# Patient Record
Sex: Female | Born: 1985 | Race: White | Hispanic: No | Marital: Married | Smoking: Former smoker
Health system: Southern US, Community
[De-identification: ages and names within clinical notes are randomized; demographics above are authoritative.]

## PROBLEM LIST (undated history)

## (undated) DIAGNOSIS — N83209 Unspecified ovarian cyst, unspecified side: Secondary | ICD-10-CM

## (undated) DIAGNOSIS — K589 Irritable bowel syndrome without diarrhea: Secondary | ICD-10-CM

## (undated) HISTORY — PX: WISDOM TOOTH EXTRACTION: SHX21

---

## 2013-02-20 LAB — OB RESULTS CONSOLE ABO/RH: RH Type: POSITIVE

## 2013-02-20 LAB — OB RESULTS CONSOLE GC/CHLAMYDIA
Chlamydia: NEGATIVE
Gonorrhea: NEGATIVE

## 2013-02-20 LAB — OB RESULTS CONSOLE HEPATITIS B SURFACE ANTIGEN: Hepatitis B Surface Ag: NEGATIVE

## 2013-02-20 LAB — OB RESULTS CONSOLE RPR: RPR: NONREACTIVE

## 2013-02-20 LAB — OB RESULTS CONSOLE RUBELLA ANTIBODY, IGM: Rubella: IMMUNE

## 2013-02-20 LAB — OB RESULTS CONSOLE HIV ANTIBODY (ROUTINE TESTING): HIV: NONREACTIVE

## 2013-03-13 ENCOUNTER — Inpatient Hospital Stay (HOSPITAL_COMMUNITY): Admission: AD | Admit: 2013-03-13 | Payer: Self-pay | Source: Ambulatory Visit | Admitting: Obstetrics and Gynecology

## 2013-07-03 ENCOUNTER — Other Ambulatory Visit: Payer: Self-pay | Admitting: Obstetrics and Gynecology

## 2013-07-03 ENCOUNTER — Other Ambulatory Visit (HOSPITAL_COMMUNITY): Payer: Self-pay | Admitting: Obstetrics and Gynecology

## 2013-07-03 DIAGNOSIS — R1011 Right upper quadrant pain: Secondary | ICD-10-CM

## 2013-07-05 ENCOUNTER — Ambulatory Visit (HOSPITAL_COMMUNITY)
Admission: RE | Admit: 2013-07-05 | Discharge: 2013-07-05 | Disposition: A | Discharge status: Home / Self Care | Payer: BC Managed Care – PPO | Source: Ambulatory Visit | Attending: Obstetrics and Gynecology | Admitting: Obstetrics and Gynecology

## 2013-07-05 ENCOUNTER — Ambulatory Visit (HOSPITAL_COMMUNITY): Payer: Self-pay

## 2013-07-05 DIAGNOSIS — R1011 Right upper quadrant pain: Secondary | ICD-10-CM | POA: Insufficient documentation

## 2013-07-05 DIAGNOSIS — K824 Cholesterolosis of gallbladder: Secondary | ICD-10-CM | POA: Insufficient documentation

## 2013-07-05 DIAGNOSIS — R110 Nausea: Secondary | ICD-10-CM | POA: Insufficient documentation

## 2013-09-24 ENCOUNTER — Encounter (HOSPITAL_COMMUNITY): Payer: BC Managed Care – PPO | Admitting: Anesthesiology

## 2013-09-24 ENCOUNTER — Inpatient Hospital Stay (HOSPITAL_COMMUNITY): Payer: BC Managed Care – PPO | Admitting: Anesthesiology

## 2013-09-24 ENCOUNTER — Encounter (HOSPITAL_COMMUNITY): Payer: Self-pay | Admitting: *Deleted

## 2013-09-24 ENCOUNTER — Inpatient Hospital Stay (HOSPITAL_COMMUNITY)
Admission: AD | Admit: 2013-09-24 | Discharge: 2013-09-27 | DRG: 775 | Disposition: A | Discharge status: Home / Self Care | Payer: BC Managed Care – PPO | Source: Ambulatory Visit | Attending: Obstetrics and Gynecology | Admitting: Obstetrics and Gynecology

## 2013-09-24 DIAGNOSIS — Z3490 Encounter for supervision of normal pregnancy, unspecified, unspecified trimester: Secondary | ICD-10-CM

## 2013-09-24 DIAGNOSIS — Z87891 Personal history of nicotine dependence: Secondary | ICD-10-CM

## 2013-09-24 HISTORY — DX: Unspecified ovarian cyst, unspecified side: N83.209

## 2013-09-24 HISTORY — DX: Irritable bowel syndrome without diarrhea: K58.9

## 2013-09-24 LAB — ABO/RH: ABO/RH(D): O POS

## 2013-09-24 LAB — CBC
HCT: 36.6 % (ref 36.0–46.0)
Hemoglobin: 12.5 g/dL (ref 12.0–15.0)
MCHC: 34.2 g/dL (ref 30.0–36.0)
Platelets: 238 10*3/uL (ref 150–400)
RDW: 13.6 % (ref 11.5–15.5)
WBC: 13.3 10*3/uL — ABNORMAL HIGH (ref 4.0–10.5)

## 2013-09-24 LAB — RPR: RPR Ser Ql: NONREACTIVE

## 2013-09-24 LAB — TYPE AND SCREEN: ABO/RH(D): O POS

## 2013-09-24 LAB — POCT FERN TEST

## 2013-09-24 MED ORDER — LIDOCAINE HCL (PF) 1 % IJ SOLN
INTRAMUSCULAR | Status: DC | PRN
  Administered 2013-09-24 (×4): 4 mL

## 2013-09-24 MED ORDER — LACTATED RINGERS IV SOLN: 500.0000 mL | Freq: Once | INTRAVENOUS | Status: DC

## 2013-09-24 MED ORDER — LACTATED RINGERS IV SOLN
INTRAVENOUS | Status: DC
  Administered 2013-09-24 (×2): via INTRAVENOUS

## 2013-09-24 MED ORDER — PHENYLEPHRINE 40 MCG/ML (10ML) SYRINGE FOR IV PUSH (FOR BLOOD PRESSURE SUPPORT)
80.0000 ug | PREFILLED_SYRINGE | INTRAVENOUS | Status: DC | PRN
  Filled 2013-09-24: qty 10

## 2013-09-24 MED ORDER — EPHEDRINE 5 MG/ML INJ
10.0000 mg | INTRAVENOUS | Status: DC | PRN
  Filled 2013-09-24: qty 4

## 2013-09-24 MED ORDER — OXYTOCIN BOLUS FROM INFUSION
500.0000 mL | INTRAVENOUS | Status: DC
  Administered 2013-09-25: 500 mL via INTRAVENOUS

## 2013-09-24 MED ORDER — ONDANSETRON HCL 4 MG/2ML IJ SOLN: 4.0000 mg | Freq: Four times a day (QID) | INTRAMUSCULAR | Status: DC | PRN

## 2013-09-24 MED ORDER — IBUPROFEN 600 MG PO TABS
600.0000 mg | ORAL_TABLET | Freq: Four times a day (QID) | ORAL | Status: DC | PRN
  Administered 2013-09-25: 600 mg via ORAL
  Filled 2013-09-24: qty 1

## 2013-09-24 MED ORDER — EPHEDRINE 5 MG/ML INJ: 10.0000 mg | INTRAVENOUS | Status: DC | PRN

## 2013-09-24 MED ORDER — OXYTOCIN 40 UNITS IN LACTATED RINGERS INFUSION - SIMPLE MED: 62.5000 mL/h | INTRAVENOUS | Status: DC

## 2013-09-24 MED ORDER — ZOLPIDEM TARTRATE 5 MG PO TABS
5.0000 mg | ORAL_TABLET | Freq: Every evening | ORAL | Status: DC | PRN
  Administered 2013-09-25: 5 mg via ORAL
  Filled 2013-09-24: qty 1

## 2013-09-24 MED ORDER — LIDOCAINE HCL (PF) 1 % IJ SOLN
30.0000 mL | INTRAMUSCULAR | Status: DC | PRN
  Administered 2013-09-25: 30 mL via SUBCUTANEOUS
  Filled 2013-09-24: qty 30

## 2013-09-24 MED ORDER — DIPHENHYDRAMINE HCL 50 MG/ML IJ SOLN: 12.5000 mg | INTRAMUSCULAR | Status: DC | PRN

## 2013-09-24 MED ORDER — LACTATED RINGERS IV SOLN: 500.0000 mL | INTRAVENOUS | Status: DC | PRN

## 2013-09-24 MED ORDER — PHENYLEPHRINE 40 MCG/ML (10ML) SYRINGE FOR IV PUSH (FOR BLOOD PRESSURE SUPPORT): 80.0000 ug | PREFILLED_SYRINGE | INTRAVENOUS | Status: DC | PRN

## 2013-09-24 MED ORDER — FENTANYL 2.5 MCG/ML BUPIVACAINE 1/10 % EPIDURAL INFUSION (WH - ANES)
14.0000 mL/h | INTRAMUSCULAR | Status: DC | PRN
  Administered 2013-09-24 – 2013-09-25 (×2): 14 mL/h via EPIDURAL
  Filled 2013-09-24 (×2): qty 125

## 2013-09-24 MED ORDER — ACETAMINOPHEN 325 MG PO TABS: 650.0000 mg | ORAL_TABLET | ORAL | Status: DC | PRN

## 2013-09-24 MED ORDER — CITRIC ACID-SODIUM CITRATE 334-500 MG/5ML PO SOLN: 30.0000 mL | ORAL | Status: DC | PRN

## 2013-09-24 NOTE — Progress Notes (Signed)
Notified Dr.Adkins of FHR, pt SVE with slow change since rupture, and pt denies discomfort at this time. No new orders at this time.

## 2013-09-24 NOTE — Progress Notes (Signed)
Dr Renaldo Fiddler notified of patient, her c/o srom. Fern positive, tracing, ctx pattern, cervical exam and aware that patient wants epidural (history of severe needle phobia). Dr Renaldo Fiddler states that she will enter orders to admit to L/D unit.

## 2013-09-24 NOTE — Anesthesia Procedure Notes (Signed)
Epidural Patient location during procedure: OB Start time: 09/24/2013 4:44 PM  Staffing Performed by: anesthesiologist   Preanesthetic Checklist Completed: patient identified, site marked, surgical consent, pre-op evaluation, timeout performed, IV checked, risks and benefits discussed and monitors and equipment checked  Epidural Patient position: sitting Prep: site prepped and draped and DuraPrep Patient monitoring: continuous pulse ox and blood pressure Approach: midline Injection technique: LOR air  Needle:  Needle type: Tuohy  Needle gauge: 17 G Needle length: 9 cm and 9 Needle insertion depth: 6 cm Catheter type: closed end flexible Catheter size: 19 Gauge Catheter at skin depth: 11 cm Test dose: negative  Assessment Events: blood not aspirated, injection not painful, no injection resistance, negative IV test and no paresthesia  Additional Notes Discussed risk of headache, infection, bleeding, nerve injury and failed or incomplete block.  Patient voices understanding and wishes to proceed.  Epidural placed easily on first attempt.  No paresthesia.  Patient tolerated procedure well with no apparent complications.  Jasmine December, MDReason for block:procedure for pain

## 2013-09-24 NOTE — MAU Note (Signed)
Started leaking clear fluid around 0800, still coming, slightly pink tint now. No active bleeding.  Kind of cramy. Last week was fingertip.

## 2013-09-24 NOTE — Anesthesia Preprocedure Evaluation (Signed)
Anesthesia Evaluation  Patient identified by MRN, date of birth, ID band Patient awake    Reviewed: Allergy & Precautions, H&P , NPO status , Patient's Chart, lab work & pertinent test results, reviewed documented beta blocker date and time   History of Anesthesia Complications Negative for: history of anesthetic complications  Airway Mallampati: I TM Distance: >3 FB Neck ROM: full    Dental  (+) Teeth Intact   Pulmonary former smoker,  breath sounds clear to auscultation        Cardiovascular negative cardio ROS  Rhythm:regular Rate:Normal     Neuro/Psych negative neurological ROS  negative psych ROS   GI/Hepatic Neg liver ROS, IBS   Endo/Other  BMI 37  Renal/GU negative Renal ROS  negative genitourinary   Musculoskeletal   Abdominal   Peds  Hematology negative hematology ROS (+)   Anesthesia Other Findings   Reproductive/Obstetrics (+) Pregnancy                           Anesthesia Physical Anesthesia Plan  ASA: II  Anesthesia Plan: Epidural   Post-op Pain Management:    Induction:   Airway Management Planned:   Additional Equipment:   Intra-op Plan:   Post-operative Plan:   Informed Consent: I have reviewed the patients History and Physical, chart, labs and discussed the procedure including the risks, benefits and alternatives for the proposed anesthesia with the patient or authorized representative who has indicated his/her understanding and acceptance.     Plan Discussed with:   Anesthesia Plan Comments:         Anesthesia Quick Evaluation

## 2013-09-24 NOTE — H&P (Signed)
Tara Barnett is a 27 y.o. female presenting for SROM & ctx.  Pt reports clear fluid gush followed by increase contractions.  History OB History   Grav Para Term Preterm Abortions TAB SAB Ect Mult Living   1              Past Medical History  Diagnosis Date  . IBS (irritable bowel syndrome)   . Ovarian cyst    Past Surgical History  Procedure Laterality Date  . Wisdom tooth extraction     Family History: family history includes Diabetes in her maternal grandfather; Stroke in her maternal grandfather. There is no history of Hearing loss. Social History:  reports that she has quit smoking. She has never used smokeless tobacco. She reports that she does not drink alcohol or use illicit drugs.   Prenatal Transfer Tool  Maternal Diabetes: No Genetic Screening: Declined Maternal Ultrasounds/Referrals: Normal Fetal Ultrasounds or other Referrals:  None Maternal Substance Abuse:  No Significant Maternal Medications:  None Significant Maternal Lab Results:  None Other Comments:  None  ROS  Dilation: 1 Effacement (%): 60 Station: -3 Exam by:: Peace [place], rn Blood pressure 139/80, pulse 88, temperature 98.4 F (36.9 C), temperature source Oral, resp. rate 20, height 5\' 8"  (1.727 m), weight 109.77 kg (242 lb). Exam Physical Exam  Prenatal labs: ABO, Rh: O/Positive/-- (05/27 0000) Antibody: Negative (05/27 0000) Rubella: Immune (05/27 0000) RPR: Nonreactive (05/27 0000)  HBsAg: Negative (05/27 0000)  HIV: Non-reactive (05/27 0000)  GBS: Negative (12/18 0000)   Assessment/Plan: SROM/LABOR Admit Exp mngt Epidural prn   Tara Barnett 09/24/2013, 4:34 PM

## 2013-09-25 ENCOUNTER — Encounter (HOSPITAL_COMMUNITY): Payer: Self-pay

## 2013-09-25 MED ORDER — WITCH HAZEL-GLYCERIN EX PADS: 1.0000 "application " | MEDICATED_PAD | CUTANEOUS | Status: DC | PRN

## 2013-09-25 MED ORDER — DIBUCAINE 1 % RE OINT
1.0000 "application " | TOPICAL_OINTMENT | RECTAL | Status: DC | PRN
  Filled 2013-09-25: qty 28

## 2013-09-25 MED ORDER — PRENATAL MULTIVITAMIN CH
1.0000 | ORAL_TABLET | Freq: Every day | ORAL | Status: DC
  Administered 2013-09-25 – 2013-09-27 (×3): 1 via ORAL
  Filled 2013-09-25 (×3): qty 1

## 2013-09-25 MED ORDER — BENZOCAINE-MENTHOL 20-0.5 % EX AERO
1.0000 "application " | INHALATION_SPRAY | CUTANEOUS | Status: DC | PRN
  Administered 2013-09-25 – 2013-09-27 (×2): 1 via TOPICAL
  Filled 2013-09-25 (×3): qty 56

## 2013-09-25 MED ORDER — DIPHENHYDRAMINE HCL 25 MG PO CAPS: 25.0000 mg | ORAL_CAPSULE | Freq: Four times a day (QID) | ORAL | Status: DC | PRN

## 2013-09-25 MED ORDER — SIMETHICONE 80 MG PO CHEW: 80.0000 mg | CHEWABLE_TABLET | ORAL | Status: DC | PRN

## 2013-09-25 MED ORDER — IBUPROFEN 600 MG PO TABS
600.0000 mg | ORAL_TABLET | Freq: Four times a day (QID) | ORAL | Status: DC
  Administered 2013-09-25 – 2013-09-27 (×9): 600 mg via ORAL
  Filled 2013-09-25 (×9): qty 1

## 2013-09-25 MED ORDER — TETANUS-DIPHTH-ACELL PERTUSSIS 5-2.5-18.5 LF-MCG/0.5 IM SUSP: 0.5000 mL | Freq: Once | INTRAMUSCULAR | Status: DC

## 2013-09-25 MED ORDER — ONDANSETRON HCL 4 MG/2ML IJ SOLN: 4.0000 mg | INTRAMUSCULAR | Status: DC | PRN

## 2013-09-25 MED ORDER — TERBUTALINE SULFATE 1 MG/ML IJ SOLN: 0.2500 mg | Freq: Once | INTRAMUSCULAR | Status: DC | PRN

## 2013-09-25 MED ORDER — MEDROXYPROGESTERONE ACETATE 150 MG/ML IM SUSP: 150.0000 mg | INTRAMUSCULAR | Status: DC | PRN

## 2013-09-25 MED ORDER — ONDANSETRON HCL 4 MG PO TABS: 4.0000 mg | ORAL_TABLET | ORAL | Status: DC | PRN

## 2013-09-25 MED ORDER — MEASLES, MUMPS & RUBELLA VAC ~~LOC~~ INJ
0.5000 mL | INJECTION | Freq: Once | SUBCUTANEOUS | Status: DC
  Filled 2013-09-25: qty 0.5

## 2013-09-25 MED ORDER — OXYTOCIN 40 UNITS IN LACTATED RINGERS INFUSION - SIMPLE MED
1.0000 m[IU]/min | INTRAVENOUS | Status: DC
  Administered 2013-09-25: 2 m[IU]/min via INTRAVENOUS
  Filled 2013-09-25: qty 1000

## 2013-09-25 MED ORDER — SENNOSIDES-DOCUSATE SODIUM 8.6-50 MG PO TABS
2.0000 | ORAL_TABLET | ORAL | Status: DC
  Administered 2013-09-25 – 2013-09-27 (×2): 2 via ORAL
  Filled 2013-09-25 (×2): qty 2

## 2013-09-25 MED ORDER — LANOLIN HYDROUS EX OINT: TOPICAL_OINTMENT | CUTANEOUS | Status: DC | PRN

## 2013-09-25 MED ORDER — HYDROMORPHONE HCL 2 MG PO TABS
1.0000 mg | ORAL_TABLET | ORAL | Status: DC | PRN
  Administered 2013-09-25: 1 mg via ORAL
  Administered 2013-09-27: 12:00:00 via ORAL
  Filled 2013-09-25 (×2): qty 1

## 2013-09-25 NOTE — Anesthesia Postprocedure Evaluation (Signed)
  Anesthesia Post-op Note  Patient: Tara Barnett  Procedure(s) Performed: * No procedures listed *  Patient Location: PACU and Mother/Baby  Anesthesia Type:Epidural  Level of Consciousness: awake, alert , oriented and patient cooperative  Airway and Oxygen Therapy: Patient Spontanous Breathing  Post-op Pain: none  Post-op Assessment: Post-op Vital signs reviewed, Patient's Cardiovascular Status Stable and Respiratory Function Stable  Post-op Vital Signs: Reviewed and stable  Complications: No apparent anesthesia complications

## 2013-09-25 NOTE — Anesthesia Postprocedure Evaluation (Signed)
Anesthesia Post Note  Patient: Tara Barnett  Procedure(s) Performed: * No procedures listed *  Anesthesia type: Epidural  Patient location: Mother/Baby  Post pain: Pain level controlled  Post assessment: Post-op Vital signs reviewed  Last Vitals:  Filed Vitals:   09/25/13 1350  BP: 115/80  Pulse: 74  Temp: 36.9 C  Resp: 18    Post vital signs: Reviewed  Level of consciousness:alert  Complications: No apparent anesthesia complications

## 2013-09-25 NOTE — Progress Notes (Signed)
Post Partum Day 0 Subjective: no complaints  Objective: Blood pressure 128/69, pulse 98, temperature 98.5 F (36.9 C), temperature source Oral, resp. rate 16, height 5\' 8"  (1.727 m), weight 242 lb (109.77 kg), unknown if currently breastfeeding.  Physical Exam:  General: alert, cooperative and appears stated age Lochia: appropriate Uterine Fundus: firm Incision: not evaluated DVT Evaluation: No evidence of DVT seen on physical exam. Negative Homan's sign. No cords or calf tenderness.   Recent Labs  09/24/13 1320  HGB 12.5  HCT 36.6    Assessment/Plan: Plan to discharge home on PPD2.   LOS: 1 day   Tara Barnett 09/25/2013, 9:49 AM

## 2013-09-25 NOTE — Progress Notes (Signed)
SVD of vigerous female infant w/ apgars of 8,9.  OP presentation Placenta delivered spontaneous w/ 3VC.   2nd degree lac repaired w/ 3-0 vicryl rapide.  Fundus firm.  EBL 400cc .

## 2013-09-25 NOTE — Lactation Note (Signed)
This note was copied from the chart of Tara Barnett. Lactation Consultation Note  Patient Name: Tara Topanga Alvelo [id number] Date: 09/25/2013 Reason for consult: Follow-up assessment of 16 hour old baby. Called to mom's room, parents concerned that baby has not been feeding well all day. Mom has been fitted with #20 NS. Assisted mom with positioning, football hold, and hand expression of breast. Mom able to express drops of colostrum, baby licks and swallows drops. Performed suck training with gloved finger and drops of colostrum. Baby began by biting finger, but did suck well. Enc FOB to assist mom with suck training the baby prior to latching on. Enc parents to attempt to feed the baby in the same manner following the baby's bath--after baby more awake and alert.   Maternal Data Formula Feeding for Exclusion: No Infant to breast within first hour of birth: Yes (initial latch = 8; breastfed 30 minutes) Has patient been taught Hand Expression?: Yes (both parents verbalize understanding of hand expression) Does the patient have breastfeeding experience prior to this delivery?: No  Feeding Feeding Type: Breast Fed Length of feed: 0 min  LATCH Score/Interventions Latch: Too sleepy or reluctant, no latch achieved, no sucking elicited. (Baby about to get bathed, mom is set up to attempt to feed in footbally position.) Intervention(s): Skin to skin;Waking techniques;Teach feeding cues  Audible Swallowing: None (Hand expressed drops of colostrum into mouth, baby licked and swallowed. ) Intervention(s): Skin to skin;Hand expression  Type of Nipple: Flat (Tends to invert when breast is compressed.) Intervention(s):  (Demonstrated nipple shield #20.)  Comfort (Breast/Nipple): Filling, red/small blisters or bruises, mild/mod discomfort  Problem noted: Filling;Mild/Moderate discomfort Interventions (Filling): Massage Interventions (Mild/moderate discomfort): Hand expression  Hold  (Positioning): Assistance needed to correctly position infant at breast and maintain latch. (Dad at bedside helping with positioning and baby's hands.) Intervention(s): Breastfeeding basics reviewed;Support Pillows;Position options  LATCH Score: 3  Lactation Tools Discussed/Used Tools: Nipple Shields Nipple shield size: 20   Consult Status Consult Status: Follow-up Date: 09/26/13 Follow-up type: In-patient    Geralynn Ochs 09/25/2013, 10:30 PM

## 2013-09-25 NOTE — Lactation Note (Signed)
This note was copied from the chart of Tara Barnett. Lactation Consultation Note  Patient Name: Tara Doninique Lwin [id number] Date: 09/25/2013 Reason for consult: Initial assessment of this primipara and her newborn at 81 hours of age.  FOB holding baby who is asleep and swaddled but mom has been placing him STS and his initial feeding was 30 minutes with LATCH score=8.  Mom reports being shown hand expression and LC reviewed reasons for expressing colostrum/milk, encouraged STS and cue feeding and reviewed normal newborn sleepiness during first 24 hours. LC encouraged review of Baby and Me pp 14 and 20-25 for STS and BF information. LC provided [employer] brochure and reviewed [facility] services and list of community and web site resources.     Maternal Data Formula Feeding for Exclusion: No Infant to breast within first hour of birth: Yes (initial latch = 8; breastfed 30 minutes) Has patient been taught Hand Expression?: Yes (both parents verbalize understanding of hand expression) Does the patient have breastfeeding experience prior to this delivery?: No  Feeding    LATCH Score/Interventions         Initial LATCH score=8             Lactation Tools Discussed/Used   STS, hand expression, cue feeding  Consult Status Consult Status: Follow-up Date: 09/26/13 Follow-up type: In-patient    Warrick Parisian [facility] 09/25/2013, 8:54 PM

## 2013-09-25 NOTE — Progress Notes (Signed)
Vaginal delivery of live viable female by Dr.Adkins at [phone].

## 2013-09-26 LAB — CBC
HCT: 30.9 % — ABNORMAL LOW (ref 36.0–46.0)
Hemoglobin: 10.4 g/dL — ABNORMAL LOW (ref 12.0–15.0)
MCH: 30.5 pg (ref 26.0–34.0)
MCHC: 33.7 g/dL (ref 30.0–36.0)
MCV: 90.6 fL (ref 78.0–100.0)
RBC: 3.41 MIL/uL — ABNORMAL LOW (ref 3.87–5.11)
WBC: 12 10*3/uL — ABNORMAL HIGH (ref 4.0–10.5)

## 2013-09-26 NOTE — Progress Notes (Signed)
Post Partum Day one Subjective: no complaints  Objective: Blood pressure 124/82, pulse 89, temperature 98.1 F (36.7 C), temperature source Oral, resp. rate 18, height 5\' 8"  (1.727 m), weight 109.77 kg (242 lb), unknown if currently breastfeeding.  Physical Exam:  General: alert Lochia: appropriate Uterine Fundus: firm Incision: healing well DVT Evaluation: No evidence of DVT seen on physical exam.   Recent Labs  09/24/13 1320 09/26/13 0607  HGB 12.5 10.4*  HCT 36.6 30.9*    Assessment/Plan: Plan for discharge tomorrow   LOS: 2 days   Tara Barnett S 09/26/2013, 8:28 AM

## 2013-09-26 NOTE — Lactation Note (Signed)
This note was copied from the chart of Tara Barnett. Lactation Consultation Note  Patient Name: Tara Sheneika Walstad [id number] Date: 09/26/2013 Reason for consult: Follow-up assessment Baby has been crying and fussy today. Not latching well. Baby cries and pinches mother's nipple even with shield or he falls asleep. Mother had just pumped when I entered room, she is tearful and concern that her baby is not getting nutrition. Baby fussy, arching and pushing back when assisted with the feeding. Ns was increased to a #24 on the left breast and nipple tissue extended in the shield. Mother said the latch still remained painful. Formula placed in the shield with the curved tip syringe. Baby suckled more calmly and less painfully. 7 ml was taken during this feeding. Baby has a tight and strong latch. Suck training using a finger advised to help baby coordinate suck. Also suggested Dr. Theora Gianotti preemie nipple as an alternative to help baby open mouth wider for feedings. Verl Dicker, RN present as plan of care discussed. Plan: breast feed with nipple shield, add formula as needed to shield. Pump to stimulate supply especially if baby isn't feeding well at breast. If additional supplement needed, formula in Dr. Theora Gianotti bottle/nipple up to 15 ml. RN to assist as needed. LC to follow. Consider OP LC visit if feedings do not improve.  Maternal Data    Feeding Feeding Type: Breast Fed Length of feed: 15 min (fed uasing #24 NS with formula added to shield via syringe)  LATCH Score/Interventions Latch:  (did not feed on day shift) Intervention(s): Skin to skin Intervention(s): Adjust position;Assist with latch;Breast massage  Audible Swallowing: Spontaneous and intermittent (when formula was added)  Type of Nipple: Flat  Comfort (Breast/Nipple): Filling, red/small blisters or bruises, mild/mod discomfort  Problem noted: Cracked, bleeding, blisters, bruises (nipples red and tender, no bleeding  noted) Interventions (Filling):  (encouraged pumping as needed for relief)  Hold (Positioning): Assistance needed to correctly position infant at breast and maintain latch. (mother making progress with latching baby) Intervention(s): Breastfeeding basics reviewed;Support Pillows;Position options;Skin to skin  LATCH Score: 6  Lactation Tools Discussed/Used Nipple shield size: 24   Consult Status Consult Status: Follow-up Date: 09/27/13 Follow-up type: In-patient    Christella Hartigan M 09/26/2013, 5:10 PM

## 2013-09-27 NOTE — Lactation Note (Signed)
This note was copied from the chart of Tara Barnett Biondo. Lactation Consultation Note: mom reports that baby has been cluster feeding through the night,She has been using the NS and supplementing with formula with curved tip syringe while at the breast. Dad great at helping mom. Assisted with latch. Encouraged to hold breast throughout the feeding and keep the baby close tot he breast. He is slipping down on the NS at times. Dr. Excell Seltzerooper encouraged continued supp until her mature milk is in. No questions at present. Has comfort gels. OP appointment made for Wed at 4 pm.   Patient Name: Tara Barnett Granderson [id number]'s Date: 09/27/2013 Reason for consult: Follow-up assessment   Maternal Data    Feeding Feeding Type: Breast Fed  LATCH Score/Interventions Latch: Repeated attempts needed to sustain latch, nipple held in mouth throughout feeding, stimulation needed to elicit sucking reflex.  Audible Swallowing: A few with stimulation  Type of Nipple: Everted at rest and after stimulation  Comfort (Breast/Nipple): Filling, red/small blisters or bruises, mild/mod discomfort  Problem noted: Mild/Moderate discomfort Interventions (Mild/moderate discomfort): Comfort gels  Hold (Positioning): Assistance needed to correctly position infant at breast and maintain latch. Intervention(s): Breastfeeding basics reviewed;Support Pillows  LATCH Score: 6  Lactation Tools Discussed/Used Tools: Nipple Shields Nipple shield size: 24   Consult Status Consult Status: Follow-up Date: 10/03/13 Follow-up type: Out-patient    Pamelia HoitWeeks, Naylee Frankowski D 09/27/2013, 10:12 AM

## 2013-09-27 NOTE — Discharge Summary (Signed)
Obstetric Discharge Summary Reason for Admission: onset of labor Prenatal Procedures: none Intrapartum Procedures: spontaneous vaginal delivery Postpartum Procedures: none Complications-Operative and Postpartum: second degree perineal laceration Hemoglobin  Date Value Range Status  09/26/2013 10.4* 12.0 - 15.0 g/dL Final     DELTA CHECK NOTED     REPEATED TO VERIFY     HCT  Date Value Range Status  09/26/2013 30.9* 36.0 - 46.0 % Final    Physical Exam:  General: alert Lochia: appropriate Uterine Fundus: firm Incision: healing well DVT Evaluation: No evidence of DVT seen on physical exam.  Discharge Diagnoses: Term Pregnancy-delivered  Discharge Information: Date: 09/27/2013 Activity: pelvic rest Diet: routine Medications: PNV and Ibuprofen Condition: stable Instructions: refer to practice specific booklet Discharge to: home   Newborn Data: Live born female  Birth Weight: 7 lb 8 oz (3402 g) APGAR: 8, 9  Home with mother.  Tara Barnett S 09/27/2013, 9:04 AM

## 2013-10-02 ENCOUNTER — Ambulatory Visit (HOSPITAL_COMMUNITY)
Admission: RE | Admit: 2013-10-02 | Discharge: 2013-10-02 | Disposition: A | Discharge status: Home / Self Care | Payer: BC Managed Care – PPO | Source: Ambulatory Visit | Attending: Obstetrics and Gynecology | Admitting: Obstetrics and Gynecology

## 2013-10-02 ENCOUNTER — Encounter (HOSPITAL_COMMUNITY)
Admission: RE | Admit: 2013-10-02 | Discharge: 2013-10-02 | Disposition: A | Discharge status: Home / Self Care | Payer: BC Managed Care – PPO | Source: Ambulatory Visit | Attending: Obstetrics and Gynecology | Admitting: Obstetrics and Gynecology

## 2013-10-02 DIAGNOSIS — O923 Agalactia: Secondary | ICD-10-CM | POA: Insufficient documentation

## 2013-10-02 NOTE — Lactation Note (Signed)
Adult Lactation Consultation Outpatient Visit Note  Patient Name: Tara Barnett(mother)    BABY: Tara Barnett Date of Birth: 1986/09/07                             DOB: [birth year] Gestational Age at Delivery: 3689w5d           BIRTH WEIGHT: 7-8 Type of Delivery: NVD                                DISCHARGE WEIGHT: 6-14.2   WEIGHT AT PEDI ON 09/29/13 6-11                                                                     WEIGHT TODAY: 6-12.1 Breastfeeding History: Frequency of Breastfeeding: NOT PUTTING BABY TO BREAST Length of Feeding:  Voids: QS Stools: 1 BROWN  Supplementing / Method:FORMULA/EBM 1.5-2 OZ EVERY 3 HOURS PER BOTTLE Pumping:  Type of Pump:MANUAL PUMP   Frequency:4-5 TIMES PER DAY  Volume:  DROPS TO 1.5 OZ  Comments:    Consultation Evaluation:Mom and 7 day old baby here for feeding assist.  Mom states that she stopped breastfeeding a few days ago after pedi weight check. due to baby's weight loss and jaundice.  She states that baby wont latch on or becomes frustrated since now bottle feeding.  She feels milk started to come in 2 days ago but only using a manual pump 4-5 times per day.  Discussed importance of adequate breast emptying every 3 hours and recommended DEBP.  Mom shown the SNS to try to get baby back to breast and she agreed to try it.  SNS taped to right breast and baby positioned in football hold.  Baby latched easily and nursed well for 15 minutes taking 60 mls of formula and transferring 6 mls from mom.  Mom rented a DEBP and plan given to 1)feed baby with any feeding cue at breast with SNS or by bottle 2 oz of EBM/FORMULA.  Explained to parents that baby will gradually require more volume and to expect baby to be taking 2-3 oz by 2 weeks.  2) pump both breasts every 3 hours either in place of feeding or after feeding.  3) start fenugreek and mothers milk tea as directed.  4) follow up appointment Tuesday 10/09/13 at 4 00pm.  Initial Feeding Assessment:15 MINUTES  USING SNS WITH 60 MLS OF FORMULA Pre-feed [id number]:3066 Post-feed Weight:3130 Amount Transferred:6 MLS Comments:BABY TOOK ALL 60 MLS OF FORMULA IN SNS  Additional Feeding Assessment: Pre-feed Weight: Post-feed Weight: Amount Transferred: Comments:  Additional Feeding Assessment: Pre-feed Weight: Post-feed Weight: Amount Transferred: Comments:  Total Breast milk Transferred this Visit: 6 mls Total Supplement Given: 60 mls  Additional Interventions:   Follow-Up Tuesday 10/09/13 4 00PM OUTPATIENT LACTATION      Tara Barnett, Tara Barnett Ann 10/02/2013, 4:41 PM

## 2013-10-03 ENCOUNTER — Ambulatory Visit (HOSPITAL_COMMUNITY): Payer: BC Managed Care – PPO

## 2013-10-09 ENCOUNTER — Ambulatory Visit (HOSPITAL_COMMUNITY)
Admission: RE | Admit: 2013-10-09 | Discharge: 2013-10-09 | Disposition: A | Discharge status: Home / Self Care | Payer: BC Managed Care – PPO | Source: Ambulatory Visit | Attending: Obstetrics and Gynecology | Admitting: Obstetrics and Gynecology

## 2013-10-09 NOTE — Lactation Note (Signed)
Adult Lactation Consultation Outpatient Visit Note  Patient Name: Tara Barnett(mother)   BABY: Tara Barnett Date of Birth: 1985/10/03                           DOB: [birth year] Gestational Age at Delivery: 4219w5d         BIRTH WEIGHT: 7-8 Type of Delivery:                                       DISCHARGE WEIGHT: 6-14.2                                                                   WEIGHT 10/02/13: 6-12.1 Breastfeeding History:                          WEIGHT TODAY: 7-6.7 Frequency of Breastfeeding: NONE Length of Feeding:  Voids: QS Stools: QS  Supplementing / Method:EBM/FORMULA 2-2.5 OZ EVERY 2-3 HOURS Pumping:  Type of Pump:SYMPHONY DEBP   Frequency:6 TIMES/24 HOURS  Volume:  30 MLS  Comments:    Consultation Evaluation:Mom and 692 week old baby here for follow up feeding assessment.  Mom was seen 1 week ago and baby was not going to breast and she was pumping 4-5 times per day with manual pump.  SNS was initiated and she rented a DEBP to post pump. Mom states SNS did not work at home so she is exclusively pumping and bottle feeding.  She also states she is taking fenugreek and mother's love tea.  Baby has gained 10.6 oz/7days.  Mom plans to continue pumping and bottle feeding until she goes back to work in 4-5 weeks.  If her milk volume increases she may decide to continue pumping after returning to work.  Mom praised for her efforts.  Questions answered.  Encouraged to call office prn. Initial Feeding Assessment: Pre-feed Weight: Post-feed Weight: Amount Transferred: Comments:  Additional Feeding Assessment: Pre-feed Weight: Post-feed Weight: Amount Transferred: Comments:  Additional Feeding Assessment: Pre-feed Weight: Post-feed Weight: Amount Transferred: Comments:  Total Breast milk Transferred this Visit:  Total Supplement Given:   Additional Interventions:   Follow-Up WILL CALL PRN      Hansel Feinsteinowell, Greer Koeppen Ann 10/09/2013, 2:06 PM

## 2013-11-02 ENCOUNTER — Encounter (HOSPITAL_COMMUNITY)
Admission: RE | Admit: 2013-11-02 | Discharge: 2013-11-02 | Disposition: A | Discharge status: Home / Self Care | Payer: BC Managed Care – PPO | Source: Ambulatory Visit | Attending: Obstetrics and Gynecology | Admitting: Obstetrics and Gynecology

## 2013-11-02 DIAGNOSIS — O923 Agalactia: Secondary | ICD-10-CM | POA: Insufficient documentation

## 2014-04-14 IMAGING — US US ABDOMEN COMPLETE
1 series · 14 of 25 positions shown · non-contrast
Comparison: None.

CLINICAL DATA: Right upper quadrant pain, nausea

EXAM:
ULTRASOUND ABDOMEN COMPLETE

[Series 1: us abdomen complete · 14 of 77 slices shown]
[im 1/77]
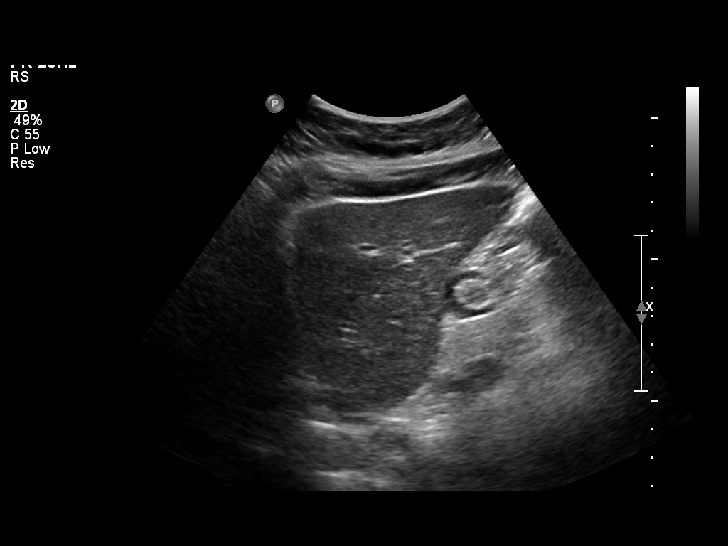
[im 7/77]
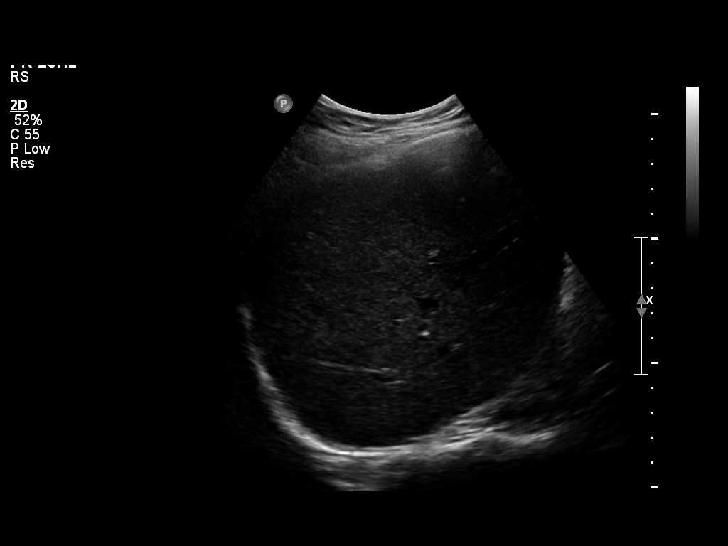
[im 13/77]
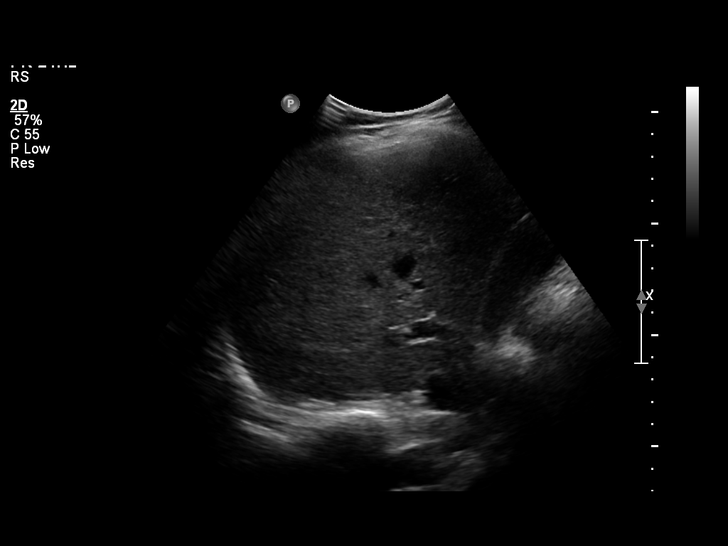
[im 20/77]
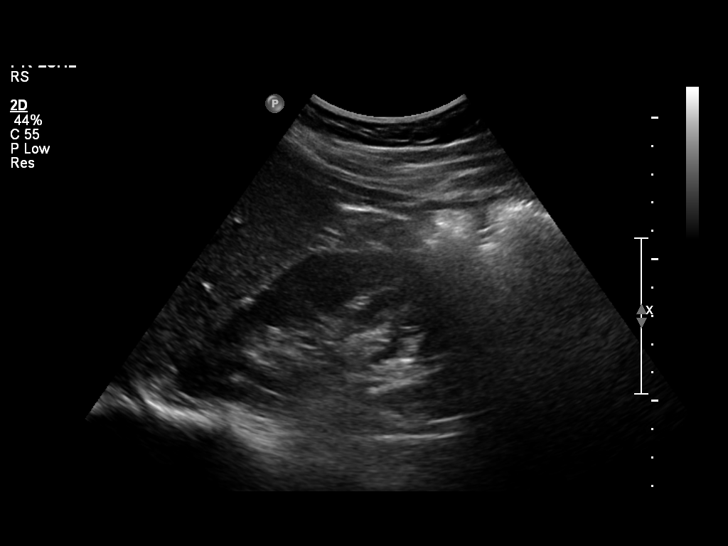
[im 26/77]
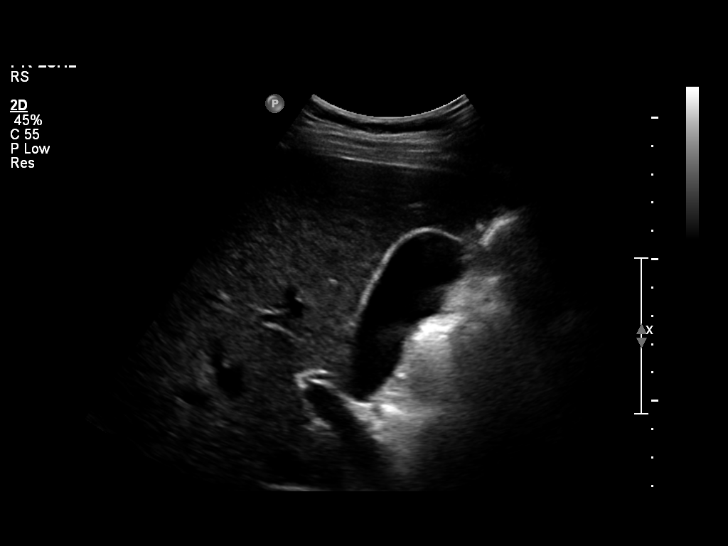
[im 29/77]
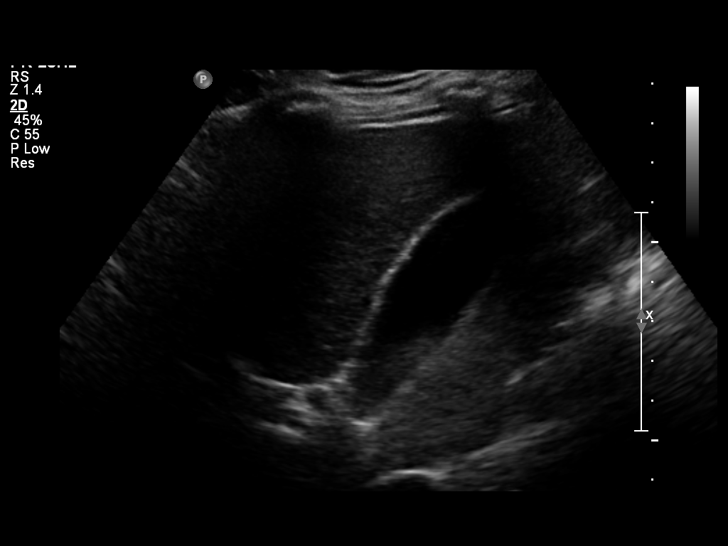
[im 35/77]
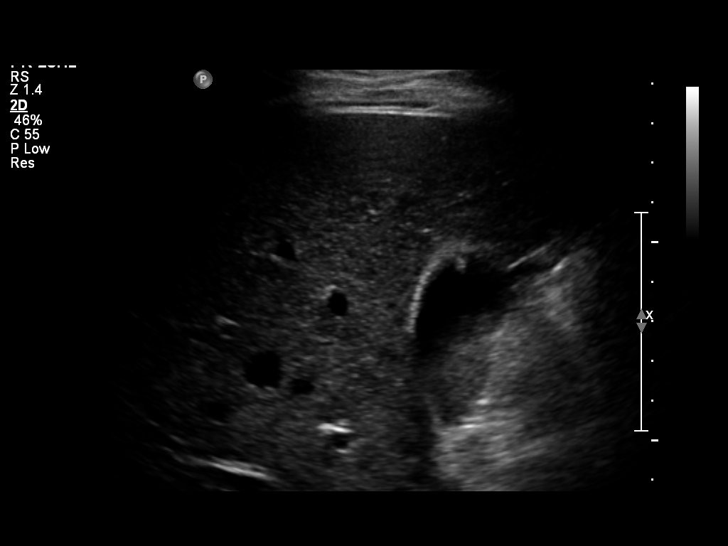
[im 42/77]
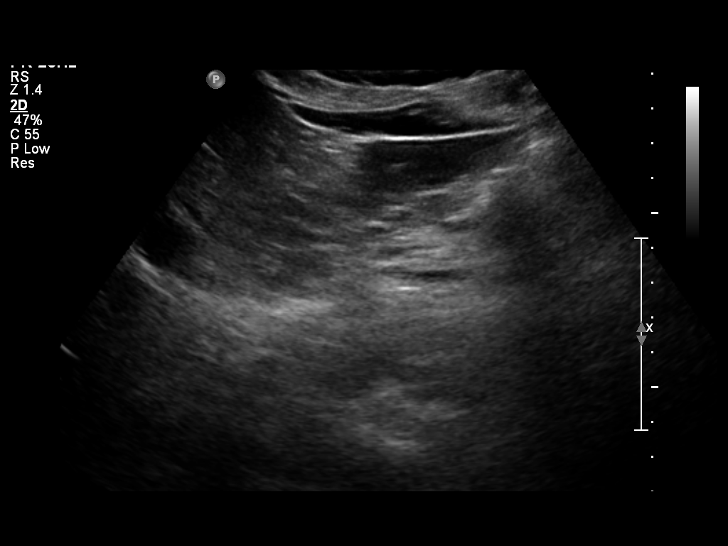
[im 48/77]
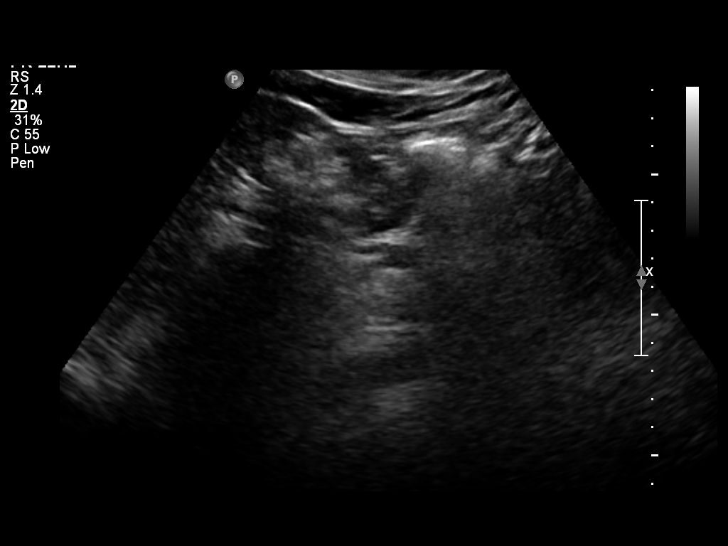
[im 51/77]
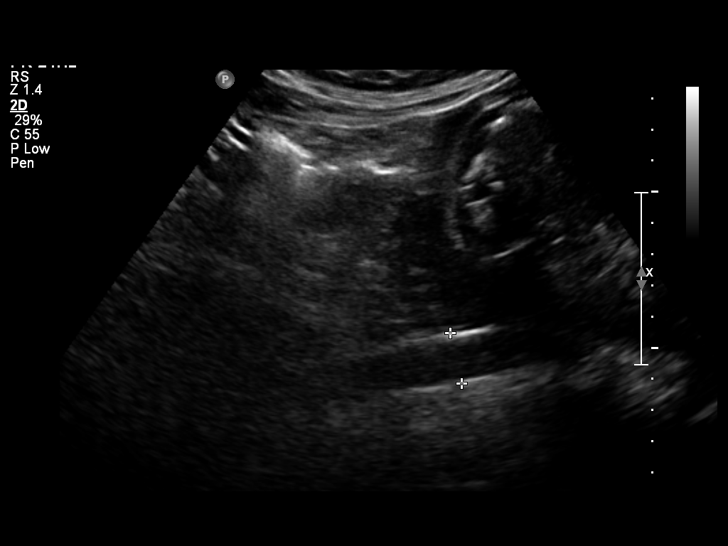
[im 58/77]
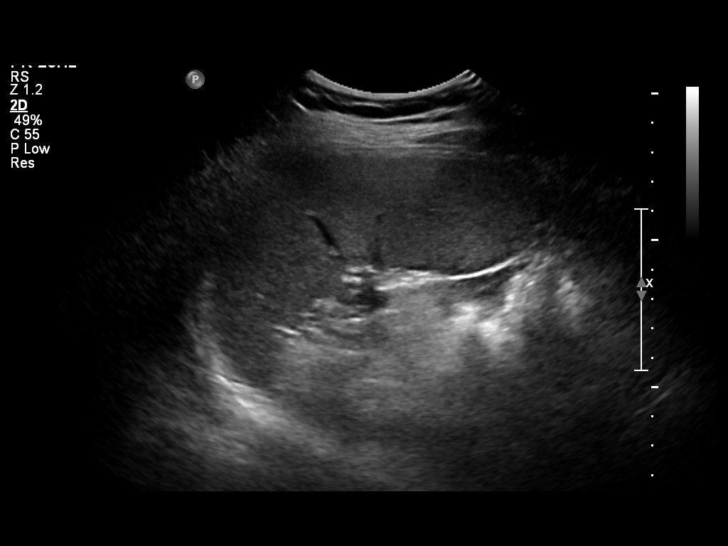
[im 64/77]
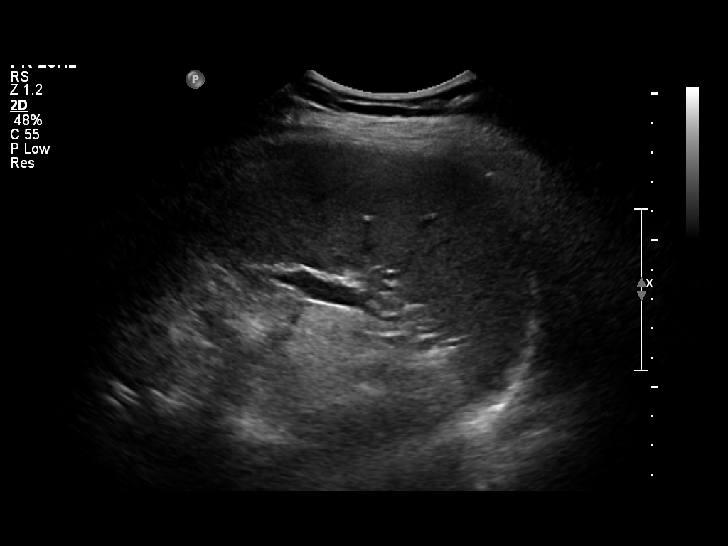
[im 70/77]
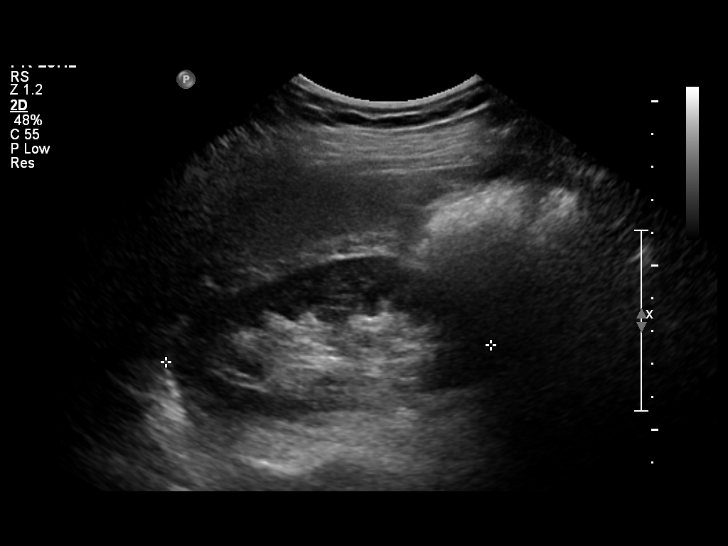
[im 77/77]
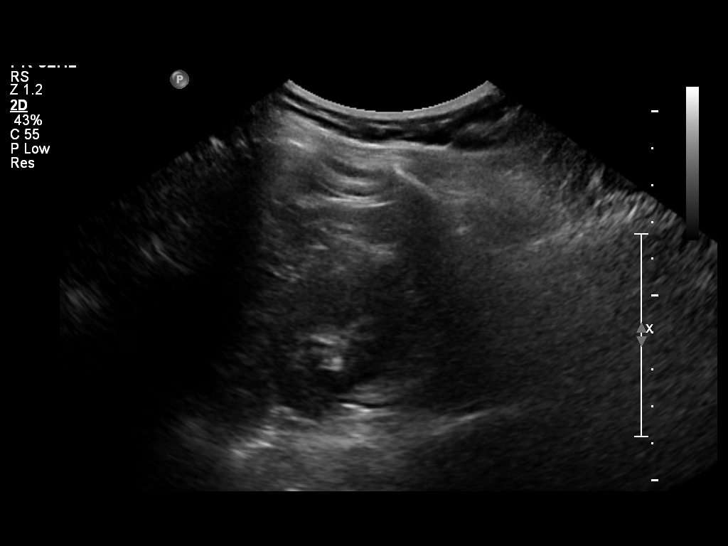

[14 of 25 positions shown; findings below may reference images not displayed]

FINDINGS: Gallbladder

4 mm polyp in the gallbladder fundus. No gallstones, gallbladder
wall thickening, or pericholecystic fluid. Negative sonographic
Murphy's sign.

Common bile duct

Diameter: 5 mm.

Liver

No focal lesion identified. Within normal limits in parenchymal
echogenicity.

IVC

No abnormality visualized.

Pancreas

Incompletely visualized but grossly unremarkable.

Spleen

Measures 11.0 cm.

Right Kidney

Length: 11.7 cm. Echogenicity within normal limits. No mass or
hydronephrosis visualized.

Left Kidney

Length: 9.9 cm. Echogenicity within normal limits. No mass or
hydronephrosis visualized.

Abdominal aorta

No aneurysm visualized.
IMPRESSION: 4 mm polyp in the gallbladder fundus, almost certainly benign. No
dedicated follow-up imaging is required given the small size.

Otherwise negative abdominal ultrasound.

This recommendation follows ACR consensus guidelines: White Paper of
the ACR Incidental Findings Committee II on Gallbladder and Biliary
Findings. [HOSPITAL] 5624:;[DATE].

## 2014-07-29 ENCOUNTER — Encounter (HOSPITAL_COMMUNITY): Payer: Self-pay

## 2019-06-01 ENCOUNTER — Other Ambulatory Visit: Payer: Self-pay | Admitting: *Deleted

## 2019-06-01 DIAGNOSIS — Z20822 Contact with and (suspected) exposure to covid-19: Secondary | ICD-10-CM

## 2019-06-02 LAB — NOVEL CORONAVIRUS, NAA: SARS-CoV-2, NAA: NOT DETECTED

## 2020-01-22 ENCOUNTER — Ambulatory Visit: Payer: Self-pay | Admitting: Dermatology

## 2021-04-08 ENCOUNTER — Other Ambulatory Visit: Payer: Self-pay | Admitting: Family Medicine

## 2021-04-08 ENCOUNTER — Ambulatory Visit
Admission: RE | Admit: 2021-04-08 | Discharge: 2021-04-08 | Disposition: A | Discharge status: Home / Self Care | Payer: BC Managed Care – PPO | Source: Ambulatory Visit | Attending: Family Medicine | Admitting: Family Medicine

## 2021-04-08 DIAGNOSIS — R053 Chronic cough: Secondary | ICD-10-CM

## 2021-04-08 DIAGNOSIS — R0789 Other chest pain: Secondary | ICD-10-CM

## 2022-01-16 IMAGING — CR DG CHEST 2V
2 series · 2 of 2 positions shown · non-contrast
Comparison: None

CLINICAL DATA: Persistent cough, chest pain

EXAM:
CHEST - 2 VIEW

[w chest pa]
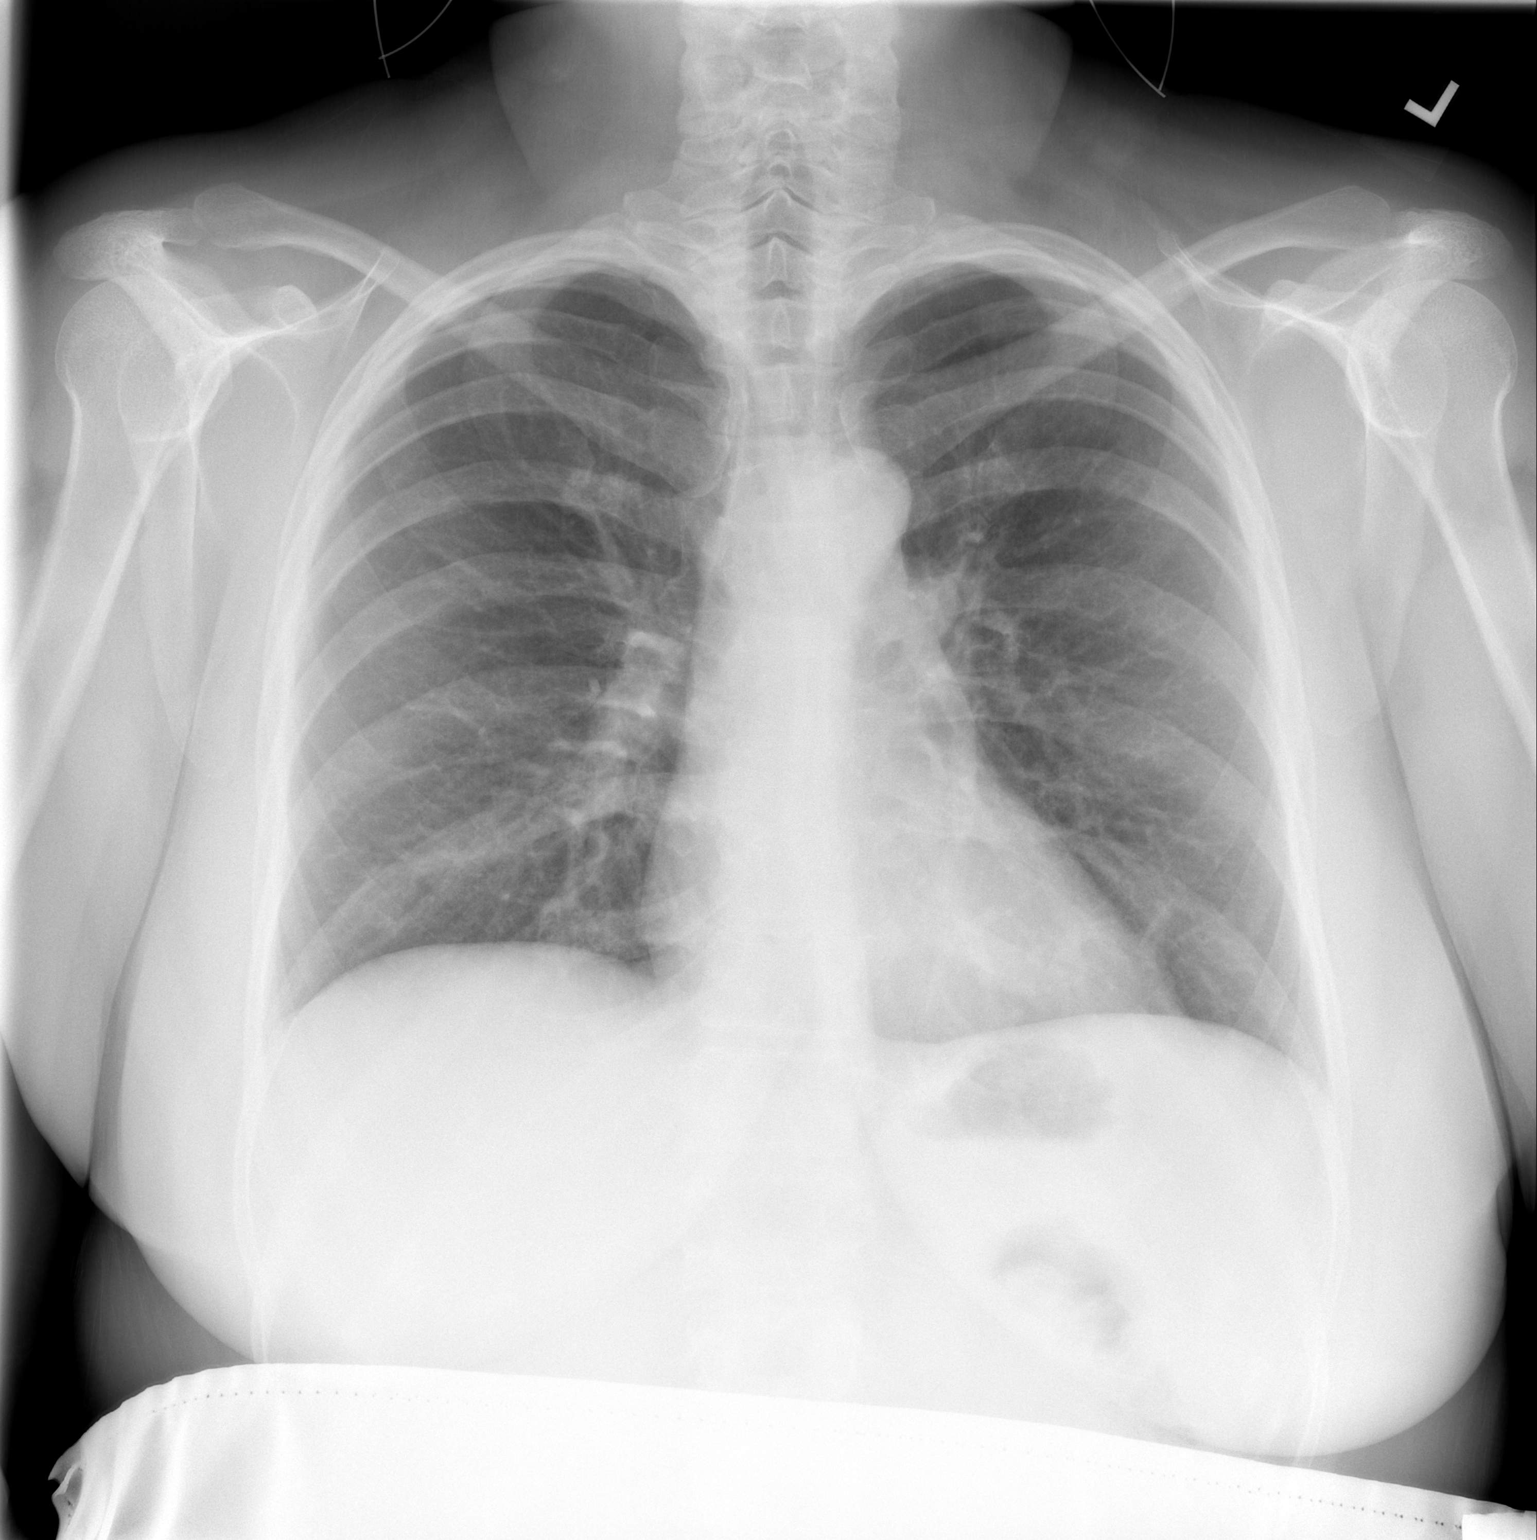

[w chest lat]
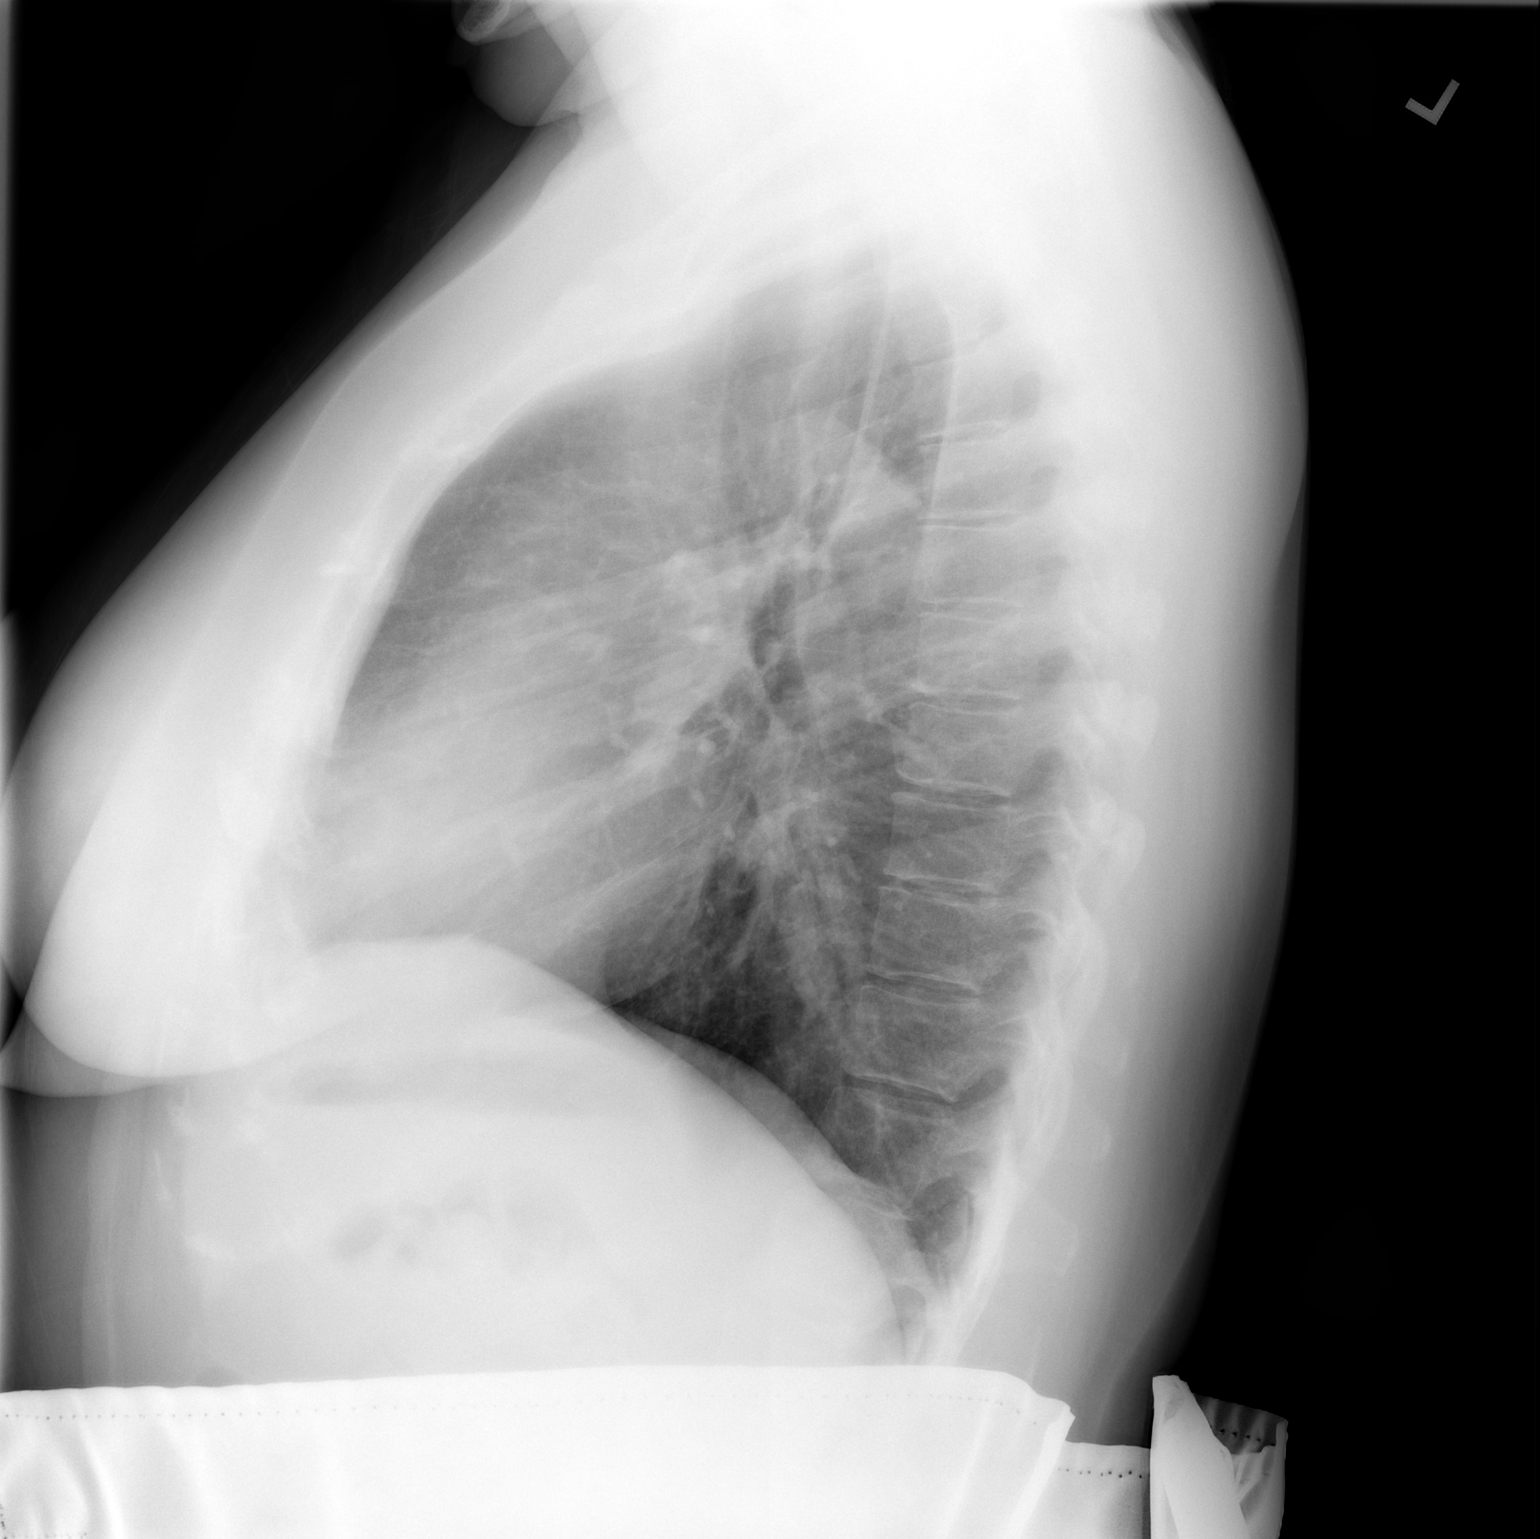

[2 of 2 positions shown; findings below may reference images not displayed]

FINDINGS: Normal heart size, mediastinal contours, and pulmonary vascularity.

Lungs clear.

No pleural effusion or pneumothorax.

Bones unremarkable.
IMPRESSION: Normal exam.
